# Patient Record
Sex: Female | Born: 1945
Health system: Southern US, Community
[De-identification: ages and names within clinical notes are randomized; demographics above are authoritative.]

## PROBLEM LIST (undated history)

## (undated) DIAGNOSIS — T7840XA Allergy, unspecified, initial encounter: Secondary | ICD-10-CM

## (undated) DIAGNOSIS — A0472 Enterocolitis due to Clostridium difficile, not specified as recurrent: Secondary | ICD-10-CM

## (undated) DIAGNOSIS — D649 Anemia, unspecified: Secondary | ICD-10-CM

## (undated) DIAGNOSIS — F329 Major depressive disorder, single episode, unspecified: Secondary | ICD-10-CM

## (undated) DIAGNOSIS — M1990 Unspecified osteoarthritis, unspecified site: Secondary | ICD-10-CM

## (undated) DIAGNOSIS — Z86010 Personal history of colonic polyps: Principal | ICD-10-CM

## (undated) DIAGNOSIS — H269 Unspecified cataract: Secondary | ICD-10-CM

## (undated) DIAGNOSIS — K219 Gastro-esophageal reflux disease without esophagitis: Secondary | ICD-10-CM

## (undated) DIAGNOSIS — F32A Depression, unspecified: Secondary | ICD-10-CM

## (undated) DIAGNOSIS — E785 Hyperlipidemia, unspecified: Secondary | ICD-10-CM

## (undated) HISTORY — DX: Unspecified osteoarthritis, unspecified site: M19.90

## (undated) HISTORY — DX: Unspecified cataract: H26.9

## (undated) HISTORY — DX: Personal history of colonic polyps: Z86.010

## (undated) HISTORY — DX: Depression, unspecified: F32.A

## (undated) HISTORY — DX: Allergy, unspecified, initial encounter: T78.40XA

## (undated) HISTORY — PX: COLONOSCOPY: SHX174

## (undated) HISTORY — DX: Enterocolitis due to Clostridium difficile, not specified as recurrent: A04.72

## (undated) HISTORY — DX: Major depressive disorder, single episode, unspecified: F32.9

## (undated) HISTORY — DX: Gastro-esophageal reflux disease without esophagitis: K21.9

## (undated) HISTORY — DX: Anemia, unspecified: D64.9

## (undated) HISTORY — DX: Hyperlipidemia, unspecified: E78.5

---

## 1970-02-13 HISTORY — PX: WISDOM TOOTH EXTRACTION: SHX21

## 1985-02-13 HISTORY — PX: TUBAL LIGATION: SHX77

## 2015-07-26 DIAGNOSIS — M62838 Other muscle spasm: Secondary | ICD-10-CM | POA: Diagnosis not present

## 2015-07-26 DIAGNOSIS — R278 Other lack of coordination: Secondary | ICD-10-CM | POA: Diagnosis not present

## 2015-07-26 DIAGNOSIS — N8189 Other female genital prolapse: Secondary | ICD-10-CM | POA: Diagnosis not present

## 2015-07-26 DIAGNOSIS — M6281 Muscle weakness (generalized): Secondary | ICD-10-CM | POA: Diagnosis not present

## 2015-08-23 DIAGNOSIS — R278 Other lack of coordination: Secondary | ICD-10-CM | POA: Diagnosis not present

## 2015-08-23 DIAGNOSIS — M62838 Other muscle spasm: Secondary | ICD-10-CM | POA: Diagnosis not present

## 2015-08-23 DIAGNOSIS — M6281 Muscle weakness (generalized): Secondary | ICD-10-CM | POA: Diagnosis not present

## 2015-08-23 DIAGNOSIS — R159 Full incontinence of feces: Secondary | ICD-10-CM | POA: Diagnosis not present

## 2015-11-23 DIAGNOSIS — F329 Major depressive disorder, single episode, unspecified: Secondary | ICD-10-CM | POA: Diagnosis not present

## 2015-11-23 DIAGNOSIS — Z23 Encounter for immunization: Secondary | ICD-10-CM | POA: Diagnosis not present

## 2015-11-23 DIAGNOSIS — D126 Benign neoplasm of colon, unspecified: Secondary | ICD-10-CM | POA: Diagnosis not present

## 2015-11-23 DIAGNOSIS — I7300 Raynaud's syndrome without gangrene: Secondary | ICD-10-CM | POA: Diagnosis not present

## 2015-11-23 DIAGNOSIS — N8189 Other female genital prolapse: Secondary | ICD-10-CM | POA: Diagnosis not present

## 2015-11-23 DIAGNOSIS — M1990 Unspecified osteoarthritis, unspecified site: Secondary | ICD-10-CM | POA: Diagnosis not present

## 2015-11-23 DIAGNOSIS — Z1231 Encounter for screening mammogram for malignant neoplasm of breast: Secondary | ICD-10-CM | POA: Diagnosis not present

## 2015-11-23 DIAGNOSIS — Z0000 Encounter for general adult medical examination without abnormal findings: Secondary | ICD-10-CM | POA: Diagnosis not present

## 2015-11-23 DIAGNOSIS — D229 Melanocytic nevi, unspecified: Secondary | ICD-10-CM | POA: Diagnosis not present

## 2015-11-23 DIAGNOSIS — Z1389 Encounter for screening for other disorder: Secondary | ICD-10-CM | POA: Diagnosis not present

## 2015-11-23 DIAGNOSIS — E784 Other hyperlipidemia: Secondary | ICD-10-CM | POA: Diagnosis not present

## 2015-11-23 DIAGNOSIS — Z6821 Body mass index (BMI) 21.0-21.9, adult: Secondary | ICD-10-CM | POA: Diagnosis not present

## 2015-11-26 DIAGNOSIS — N390 Urinary tract infection, site not specified: Secondary | ICD-10-CM | POA: Diagnosis not present

## 2015-11-26 DIAGNOSIS — R8299 Other abnormal findings in urine: Secondary | ICD-10-CM | POA: Diagnosis not present

## 2015-11-26 DIAGNOSIS — E785 Hyperlipidemia, unspecified: Secondary | ICD-10-CM | POA: Diagnosis not present

## 2015-11-26 DIAGNOSIS — Z0000 Encounter for general adult medical examination without abnormal findings: Secondary | ICD-10-CM | POA: Diagnosis not present

## 2015-11-26 DIAGNOSIS — E784 Other hyperlipidemia: Secondary | ICD-10-CM | POA: Diagnosis not present

## 2015-12-07 ENCOUNTER — Telehealth: Payer: Self-pay | Admitting: Internal Medicine

## 2015-12-07 NOTE — Telephone Encounter (Signed)
Dr Danielle Wilkins DOD on 12-01-15 when Colon report from 2012 were received. Placed in office. Would like them reviewed so she can schedule her Recall Colon due in 66yrs.

## 2015-12-09 NOTE — Telephone Encounter (Signed)
Dr Gessner reviewed records and has accepted patient. Ok to schedule Direct Colon. Left message for patient to return my call. °

## 2015-12-10 ENCOUNTER — Encounter: Payer: Self-pay | Admitting: Internal Medicine

## 2015-12-24 DIAGNOSIS — L578 Other skin changes due to chronic exposure to nonionizing radiation: Secondary | ICD-10-CM | POA: Diagnosis not present

## 2015-12-24 DIAGNOSIS — M2012 Hallux valgus (acquired), left foot: Secondary | ICD-10-CM | POA: Diagnosis not present

## 2015-12-24 DIAGNOSIS — M2022 Hallux rigidus, left foot: Secondary | ICD-10-CM | POA: Diagnosis not present

## 2015-12-24 DIAGNOSIS — D2371 Other benign neoplasm of skin of right lower limb, including hip: Secondary | ICD-10-CM | POA: Diagnosis not present

## 2015-12-24 DIAGNOSIS — D1801 Hemangioma of skin and subcutaneous tissue: Secondary | ICD-10-CM | POA: Diagnosis not present

## 2015-12-24 DIAGNOSIS — I788 Other diseases of capillaries: Secondary | ICD-10-CM | POA: Diagnosis not present

## 2015-12-24 DIAGNOSIS — L821 Other seborrheic keratosis: Secondary | ICD-10-CM | POA: Diagnosis not present

## 2015-12-24 DIAGNOSIS — L738 Other specified follicular disorders: Secondary | ICD-10-CM | POA: Diagnosis not present

## 2016-01-26 ENCOUNTER — Ambulatory Visit (AMBULATORY_SURGERY_CENTER): Payer: Self-pay | Admitting: *Deleted

## 2016-01-26 VITALS — Ht 63.0 in | Wt 125.0 lb

## 2016-01-26 DIAGNOSIS — Z86010 Personal history of colonic polyps: Secondary | ICD-10-CM

## 2016-01-26 NOTE — Progress Notes (Signed)
No allergies to eggs or soy. No problems with anesthesia.  Pt given Emmi instructions for colonoscopy  No oxygen use  No diet drug use  

## 2016-02-09 ENCOUNTER — Encounter: Payer: Self-pay | Admitting: Internal Medicine

## 2016-02-23 ENCOUNTER — Ambulatory Visit (AMBULATORY_SURGERY_CENTER): Payer: Medicare PPO | Admitting: Internal Medicine

## 2016-02-23 ENCOUNTER — Encounter: Payer: Self-pay | Admitting: Internal Medicine

## 2016-02-23 VITALS — BP 115/55 | HR 51 | Temp 98.6°F | Resp 14 | Ht 64.0 in | Wt 124.0 lb

## 2016-02-23 DIAGNOSIS — Z800 Family history of malignant neoplasm of digestive organs: Secondary | ICD-10-CM | POA: Diagnosis not present

## 2016-02-23 DIAGNOSIS — Z860100 Personal history of colon polyps, unspecified: Secondary | ICD-10-CM

## 2016-02-23 DIAGNOSIS — D120 Benign neoplasm of cecum: Secondary | ICD-10-CM

## 2016-02-23 DIAGNOSIS — K635 Polyp of colon: Secondary | ICD-10-CM | POA: Diagnosis not present

## 2016-02-23 HISTORY — DX: Personal history of colon polyps, unspecified: Z86.0100

## 2016-02-23 HISTORY — DX: Personal history of colonic polyps: Z86.010

## 2016-02-23 NOTE — Patient Instructions (Addendum)
I found and removed one small polyp that looks benign. You also have a condition called diverticulosis - common and not usually a problem. Please read the handout provided.  I will let you know pathology results and when to have another routine colonoscopy by mail. Probably 5 years.  I appreciate the opportunity to care for you. Gatha Mayer, MD, [facility]   Discharge instructions given. Handouts on polyps and diverticulosis. Resume previous medications. YOU HAD AN ENDOSCOPIC PROCEDURE TODAY AT [facility]:   Refer to the procedure report that was given to you for any specific questions about what was found during the examination.  If the procedure report does not answer your questions, please call your gastroenterologist to clarify.  If you requested that your care partner not be given the details of your procedure findings, then the procedure report has been included in a sealed envelope for you to review at your convenience later.  YOU SHOULD EXPECT: Some feelings of bloating in the abdomen. Passage of more gas than usual.  Walking can help get rid of the air that was put into your GI tract during the procedure and reduce the bloating. If you had a lower endoscopy (such as a colonoscopy or flexible sigmoidoscopy) you may notice spotting of blood in your stool or on the toilet paper. If you underwent a bowel prep for your procedure, you may not have a normal bowel movement for a few days.  Please Note:  You might notice some irritation and congestion in your nose or some drainage.  This is from the oxygen used during your procedure.  There is no need for concern and it should clear up in a day or so.  SYMPTOMS TO REPORT IMMEDIATELY:   Following lower endoscopy (colonoscopy or flexible sigmoidoscopy):  Excessive amounts of blood in the stool  Significant tenderness or worsening of abdominal pains  Swelling of the abdomen that is new, acute  Fever of 100F or  higher   For urgent or emergent issues, a gastroenterologist can be reached at any hour by calling [phone].   DIET:  We do recommend a small meal at first, but then you may proceed to your regular diet.  Drink plenty of fluids but you should avoid alcoholic beverages for 24 hours.  ACTIVITY:  You should plan to take it easy for the rest of today and you should NOT DRIVE or use heavy machinery until tomorrow (because of the sedation medicines used during the test).    FOLLOW UP: Our staff will call the number listed on your records the next business day following your procedure to check on you and address any questions or concerns that you may have regarding the information given to you following your procedure. If we do not reach you, we will leave a message.  However, if you are feeling well and you are not experiencing any problems, there is no need to return our call.  We will assume that you have returned to your regular daily activities without incident.  If any biopsies were taken you will be contacted by phone or by letter within the next 1-3 weeks.  Please call us at [phone] if you have not heard about the biopsies in 3 weeks.    SIGNATURES/CONFIDENTIALITY: You and/or your care partner have signed paperwork which will be entered into your electronic medical record.  These signatures attest to the fact that that the information above on your After Visit Summary has  been reviewed and is understood.  Full responsibility of the confidentiality of this discharge information lies with you and/or your care-partner.

## 2016-02-23 NOTE — Progress Notes (Signed)
Report to PACU, RN, vss, BBS= Clear.  

## 2016-02-23 NOTE — Op Note (Signed)
[facility] Patient Name: Danielle Wilkins Procedure Date: 02/23/2016 2:11 PM MRN: [id number] Endoscopist: Gatha Mayer , MD Age: 71 Referring MD:  Date of Birth: 12-06-1945 Gender: Female Account #: [id number] Procedure:                Colonoscopy Indications:              High risk colon cancer surveillance: Personal                            history of colonic polyps, Surveillance: Personal                            history of adenomatous polyps on last colonoscopy 5                            years ago, Family history of colon cancer in a                            first-degree relative Medicines:                Propofol per Anesthesia, Monitored Anesthesia Care Procedure:                Pre-Anesthesia Assessment:                           - Prior to the procedure, a History and Physical                            was performed, and patient medications and                            allergies were reviewed. The patient's tolerance of                            previous anesthesia was also reviewed. The risks                            and benefits of the procedure and the sedation                            options and risks were discussed with the patient.                            All questions were answered, and informed consent                            was obtained. Prior Anticoagulants: The patient has                            taken no previous anticoagulant or antiplatelet                            agents. ASA Grade Assessment: II - A patient with  mild systemic disease. After reviewing the risks                            and benefits, the patient was deemed in                            satisfactory condition to undergo the procedure.                           After obtaining informed consent, the colonoscope                            was passed under direct vision. Throughout the                            procedure, the  patient's blood pressure, pulse, and                            oxygen saturations were monitored continuously. The                            Model CF-HQ190L [id number]) scope was introduced                            through the anus and advanced to the the cecum,                            identified by appendiceal orifice and ileocecal                            valve. The colonoscopy was performed without                            difficulty. The patient tolerated the procedure                            well. The quality of the bowel preparation was                            good. The ileocecal valve, appendiceal orifice, and                            rectum were photographed. The bowel preparation                            used was Miralax. Scope In: 2:19:17 PM Scope Out: 2:36:21 PM Scope Withdrawal Time: 0 hours 11 minutes 14 seconds  Total Procedure Duration: 0 hours 17 minutes 4 seconds  Findings:                 The perianal exam findings include skin tags.                           The digital rectal exam was normal.  A 7 mm polyp was found in the cecum. The polyp was                            sessile. The polyp was removed with a cold snare.                            Resection and retrieval were complete. Verification                            of patient identification for the specimen was                            done. Estimated blood loss was minimal.                           A few small and large-mouthed diverticula were                            found in the sigmoid colon.                           The exam was otherwise without abnormality on                            direct and retroflexion views. Complications:            No immediate complications. Estimated Blood Loss:     Estimated blood loss was minimal. Impression:               - Perianal skin tags found on perianal exam.                           - One 7 mm polyp in the  cecum, removed with a cold                            snare. Resected and retrieved.                           - Diverticulosis in the sigmoid colon.                           - The examination was otherwise normal on direct                            and retroflexion views.                           - Personal history of colonic polyps. Recommendation:           - Patient has a contact number available for                            emergencies. The signs and symptoms of potential  delayed complications were discussed with the                            patient. Return to normal activities tomorrow.                            Written discharge instructions were provided to the                            patient.                           - Resume previous diet.                           - Continue present medications.                           - Repeat colonoscopy is recommended for                            surveillance. The colonoscopy date will be                            determined after pathology results from today's                            exam become available for review. Gatha Mayer, MD 02/23/2016 2:46:07 PM This report has been signed electronically.

## 2016-02-23 NOTE — Progress Notes (Signed)
Called to room to assist during endoscopic procedure.  Patient ID and intended procedure confirmed with present staff. Received instructions for my participation in the procedure from the performing physician.  

## 2016-02-24 ENCOUNTER — Telehealth: Payer: Self-pay

## 2016-02-24 NOTE — Telephone Encounter (Signed)
Name identifier left voicemail we will call back later today.

## 2016-02-24 NOTE — Telephone Encounter (Signed)
  Follow up Call-  Call back number 02/23/2016  Post procedure Call Back phone  # [phone]  Permission to leave phone message Yes     Patient questions:  Do you have a fever, pain , or abdominal swelling? No. Pain Score  0 *  Have you tolerated food without any problems? Yes.    Have you been able to return to your normal activities? Yes.    Do you have any questions about your discharge instructions: Diet   No. Medications  No. Follow up visit  No.  Do you have questions or concerns about your Care? No.  Actions: * If pain score is 4 or above: No action needed, pain <4.

## 2016-02-28 ENCOUNTER — Encounter: Payer: Self-pay | Admitting: Internal Medicine

## 2016-02-28 DIAGNOSIS — Z86010 Personal history of colonic polyps: Secondary | ICD-10-CM

## 2016-02-28 NOTE — Progress Notes (Signed)
7 mm ssp Recall 2023

## 2016-04-20 ENCOUNTER — Telehealth: Payer: Self-pay | Admitting: Internal Medicine

## 2016-04-20 ENCOUNTER — Other Ambulatory Visit: Payer: Medicare Other

## 2016-04-20 DIAGNOSIS — R197 Diarrhea, unspecified: Secondary | ICD-10-CM | POA: Diagnosis not present

## 2016-04-20 NOTE — Telephone Encounter (Signed)
Patient notified  Orders placed.  She will come today to leave a sample

## 2016-04-20 NOTE — Telephone Encounter (Signed)
C diff PCR is next step so please do  Dx:  diarrhea

## 2016-04-20 NOTE — Telephone Encounter (Signed)
Patient with 1 week of watery diarrhea.She recently has had 2 rounds of clindamycin for a abscess tooth.  No App appts.  Can I order a c-diff on her? Would you want other studies.  She recently had a colonoscopy with you in Jan.

## 2016-04-21 ENCOUNTER — Telehealth: Payer: Self-pay | Admitting: Internal Medicine

## 2016-04-21 LAB — CLOSTRIDIUM DIFFICILE BY PCR: CDIFFPCR: DETECTED — AB

## 2016-04-21 NOTE — Telephone Encounter (Signed)
Patient is asking for patient education matherials if she does have c-diff.  I put information out front for her to pick up

## 2016-04-21 NOTE — Progress Notes (Signed)
Called patient Called in RX vancomycin 125 mg qid to [facility] x 10d Also to do Florastor 250 mg bid x 1 month  About to have grandchild (delivery soon) I told her to ask pediatrician about C diff contact issues but suspect best to avoid contact until sxs much better/resolved

## 2016-04-24 NOTE — Progress Notes (Signed)
If she has used fluconazole in past and thinks she has infection can Rx 150 mg # 1 take once no refills  Otherwise can try OTC

## 2016-04-24 NOTE — Progress Notes (Signed)
Please check on her re: sxs of C diff

## 2016-04-25 ENCOUNTER — Encounter: Payer: Self-pay | Admitting: Internal Medicine

## 2016-04-25 DIAGNOSIS — A0472 Enterocolitis due to Clostridium difficile, not specified as recurrent: Secondary | ICD-10-CM

## 2016-04-25 HISTORY — DX: Enterocolitis due to Clostridium difficile, not specified as recurrent: A04.72

## 2016-04-25 NOTE — Progress Notes (Signed)
OK sounds good  At this point if she gets recurrent diarrhea after Tx  she should call us back  Please advise that there is often some IBS after C diff so may not be same as she was for a while but need to know about any bad diarrhea  Hope her trip goes well and the new grandchild is ok

## 2016-07-05 DIAGNOSIS — Z6821 Body mass index (BMI) 21.0-21.9, adult: Secondary | ICD-10-CM | POA: Diagnosis not present

## 2016-07-05 DIAGNOSIS — E785 Hyperlipidemia, unspecified: Secondary | ICD-10-CM | POA: Diagnosis not present

## 2016-07-05 DIAGNOSIS — Z7982 Long term (current) use of aspirin: Secondary | ICD-10-CM | POA: Diagnosis not present

## 2016-07-05 DIAGNOSIS — F3340 Major depressive disorder, recurrent, in remission, unspecified: Secondary | ICD-10-CM | POA: Diagnosis not present

## 2016-07-05 DIAGNOSIS — Z9851 Tubal ligation status: Secondary | ICD-10-CM | POA: Diagnosis not present

## 2016-10-18 DIAGNOSIS — E784 Other hyperlipidemia: Secondary | ICD-10-CM | POA: Diagnosis not present

## 2016-10-18 DIAGNOSIS — Z79899 Other long term (current) drug therapy: Secondary | ICD-10-CM | POA: Diagnosis not present

## 2016-10-24 ENCOUNTER — Other Ambulatory Visit: Payer: Self-pay | Admitting: Internal Medicine

## 2016-10-24 DIAGNOSIS — E784 Other hyperlipidemia: Secondary | ICD-10-CM | POA: Diagnosis not present

## 2016-10-24 DIAGNOSIS — M1990 Unspecified osteoarthritis, unspecified site: Secondary | ICD-10-CM | POA: Diagnosis not present

## 2016-10-24 DIAGNOSIS — Z1231 Encounter for screening mammogram for malignant neoplasm of breast: Secondary | ICD-10-CM | POA: Diagnosis not present

## 2016-10-24 DIAGNOSIS — I7300 Raynaud's syndrome without gangrene: Secondary | ICD-10-CM | POA: Diagnosis not present

## 2016-10-24 DIAGNOSIS — Z23 Encounter for immunization: Secondary | ICD-10-CM | POA: Diagnosis not present

## 2016-10-24 DIAGNOSIS — D126 Benign neoplasm of colon, unspecified: Secondary | ICD-10-CM | POA: Diagnosis not present

## 2016-10-24 DIAGNOSIS — Z0000 Encounter for general adult medical examination without abnormal findings: Secondary | ICD-10-CM | POA: Diagnosis not present

## 2016-10-24 DIAGNOSIS — D229 Melanocytic nevi, unspecified: Secondary | ICD-10-CM | POA: Diagnosis not present

## 2016-10-24 DIAGNOSIS — H00019 Hordeolum externum unspecified eye, unspecified eyelid: Secondary | ICD-10-CM | POA: Diagnosis not present

## 2016-10-24 DIAGNOSIS — N8189 Other female genital prolapse: Secondary | ICD-10-CM | POA: Diagnosis not present

## 2016-10-30 DIAGNOSIS — H0011 Chalazion right upper eyelid: Secondary | ICD-10-CM | POA: Diagnosis not present

## 2016-11-08 ENCOUNTER — Ambulatory Visit
Admission: RE | Admit: 2016-11-08 | Discharge: 2016-11-08 | Disposition: A | Discharge status: Home / Self Care | Payer: Medicare PPO | Source: Ambulatory Visit | Attending: Internal Medicine | Admitting: Internal Medicine

## 2016-11-08 DIAGNOSIS — Z1231 Encounter for screening mammogram for malignant neoplasm of breast: Secondary | ICD-10-CM

## 2016-12-04 DIAGNOSIS — H0011 Chalazion right upper eyelid: Secondary | ICD-10-CM | POA: Diagnosis not present

## 2017-03-06 DIAGNOSIS — H2513 Age-related nuclear cataract, bilateral: Secondary | ICD-10-CM | POA: Diagnosis not present

## 2017-03-06 DIAGNOSIS — H524 Presbyopia: Secondary | ICD-10-CM | POA: Diagnosis not present

## 2017-03-07 DIAGNOSIS — H269 Unspecified cataract: Secondary | ICD-10-CM | POA: Diagnosis not present

## 2017-03-07 DIAGNOSIS — Z6821 Body mass index (BMI) 21.0-21.9, adult: Secondary | ICD-10-CM | POA: Diagnosis not present

## 2017-03-07 DIAGNOSIS — E785 Hyperlipidemia, unspecified: Secondary | ICD-10-CM | POA: Diagnosis not present

## 2017-03-07 DIAGNOSIS — F3340 Major depressive disorder, recurrent, in remission, unspecified: Secondary | ICD-10-CM | POA: Diagnosis not present

## 2017-03-07 DIAGNOSIS — M158 Other polyosteoarthritis: Secondary | ICD-10-CM | POA: Diagnosis not present

## 2017-04-18 DIAGNOSIS — Z6821 Body mass index (BMI) 21.0-21.9, adult: Secondary | ICD-10-CM | POA: Diagnosis not present

## 2017-04-18 DIAGNOSIS — R05 Cough: Secondary | ICD-10-CM | POA: Diagnosis not present

## 2017-04-18 DIAGNOSIS — H1013 Acute atopic conjunctivitis, bilateral: Secondary | ICD-10-CM | POA: Diagnosis not present

## 2017-04-18 DIAGNOSIS — J302 Other seasonal allergic rhinitis: Secondary | ICD-10-CM | POA: Diagnosis not present

## 2017-11-28 DIAGNOSIS — R82998 Other abnormal findings in urine: Secondary | ICD-10-CM | POA: Diagnosis not present

## 2017-11-28 DIAGNOSIS — E7849 Other hyperlipidemia: Secondary | ICD-10-CM | POA: Diagnosis not present

## 2017-12-05 DIAGNOSIS — D229 Melanocytic nevi, unspecified: Secondary | ICD-10-CM | POA: Diagnosis not present

## 2017-12-05 DIAGNOSIS — D7589 Other specified diseases of blood and blood-forming organs: Secondary | ICD-10-CM | POA: Diagnosis not present

## 2017-12-05 DIAGNOSIS — E7849 Other hyperlipidemia: Secondary | ICD-10-CM | POA: Diagnosis not present

## 2017-12-05 DIAGNOSIS — N8189 Other female genital prolapse: Secondary | ICD-10-CM | POA: Diagnosis not present

## 2017-12-05 DIAGNOSIS — D126 Benign neoplasm of colon, unspecified: Secondary | ICD-10-CM | POA: Diagnosis not present

## 2017-12-05 DIAGNOSIS — J302 Other seasonal allergic rhinitis: Secondary | ICD-10-CM | POA: Diagnosis not present

## 2017-12-05 DIAGNOSIS — Z23 Encounter for immunization: Secondary | ICD-10-CM | POA: Diagnosis not present

## 2017-12-05 DIAGNOSIS — Z0000 Encounter for general adult medical examination without abnormal findings: Secondary | ICD-10-CM | POA: Diagnosis not present

## 2017-12-05 DIAGNOSIS — I7300 Raynaud's syndrome without gangrene: Secondary | ICD-10-CM | POA: Diagnosis not present

## 2017-12-05 DIAGNOSIS — M1990 Unspecified osteoarthritis, unspecified site: Secondary | ICD-10-CM | POA: Diagnosis not present

## 2017-12-07 DIAGNOSIS — Z1212 Encounter for screening for malignant neoplasm of rectum: Secondary | ICD-10-CM | POA: Diagnosis not present

## 2018-01-04 DIAGNOSIS — D1801 Hemangioma of skin and subcutaneous tissue: Secondary | ICD-10-CM | POA: Diagnosis not present

## 2018-01-04 DIAGNOSIS — L821 Other seborrheic keratosis: Secondary | ICD-10-CM | POA: Diagnosis not present

## 2018-01-04 DIAGNOSIS — L812 Freckles: Secondary | ICD-10-CM | POA: Diagnosis not present

## 2018-01-04 DIAGNOSIS — D485 Neoplasm of uncertain behavior of skin: Secondary | ICD-10-CM | POA: Diagnosis not present

## 2018-03-11 DIAGNOSIS — H52203 Unspecified astigmatism, bilateral: Secondary | ICD-10-CM | POA: Diagnosis not present

## 2018-03-11 DIAGNOSIS — H2512 Age-related nuclear cataract, left eye: Secondary | ICD-10-CM | POA: Diagnosis not present

## 2018-05-04 DIAGNOSIS — E785 Hyperlipidemia, unspecified: Secondary | ICD-10-CM | POA: Diagnosis not present

## 2018-05-04 DIAGNOSIS — E559 Vitamin D deficiency, unspecified: Secondary | ICD-10-CM | POA: Diagnosis not present

## 2018-05-04 DIAGNOSIS — F418 Other specified anxiety disorders: Secondary | ICD-10-CM | POA: Diagnosis not present

## 2018-05-04 DIAGNOSIS — H269 Unspecified cataract: Secondary | ICD-10-CM | POA: Diagnosis not present

## 2018-05-04 DIAGNOSIS — J309 Allergic rhinitis, unspecified: Secondary | ICD-10-CM | POA: Diagnosis not present

## 2018-05-04 DIAGNOSIS — M158 Other polyosteoarthritis: Secondary | ICD-10-CM | POA: Diagnosis not present

## 2018-05-04 DIAGNOSIS — Z6820 Body mass index (BMI) 20.0-20.9, adult: Secondary | ICD-10-CM | POA: Diagnosis not present

## 2018-12-24 ENCOUNTER — Other Ambulatory Visit: Payer: Self-pay

## 2018-12-24 DIAGNOSIS — Z20822 Contact with and (suspected) exposure to covid-19: Secondary | ICD-10-CM

## 2018-12-27 LAB — NOVEL CORONAVIRUS, NAA: SARS-CoV-2, NAA: NOT DETECTED

## 2019-01-06 DIAGNOSIS — E7849 Other hyperlipidemia: Secondary | ICD-10-CM | POA: Diagnosis not present

## 2019-01-10 DIAGNOSIS — R82998 Other abnormal findings in urine: Secondary | ICD-10-CM | POA: Diagnosis not present

## 2019-01-13 DIAGNOSIS — E785 Hyperlipidemia, unspecified: Secondary | ICD-10-CM | POA: Diagnosis not present

## 2019-01-13 DIAGNOSIS — D7589 Other specified diseases of blood and blood-forming organs: Secondary | ICD-10-CM | POA: Diagnosis not present

## 2019-01-13 DIAGNOSIS — N8189 Other female genital prolapse: Secondary | ICD-10-CM | POA: Diagnosis not present

## 2019-01-13 DIAGNOSIS — K219 Gastro-esophageal reflux disease without esophagitis: Secondary | ICD-10-CM | POA: Diagnosis not present

## 2019-01-13 DIAGNOSIS — I7300 Raynaud's syndrome without gangrene: Secondary | ICD-10-CM | POA: Diagnosis not present

## 2019-01-13 DIAGNOSIS — J302 Other seasonal allergic rhinitis: Secondary | ICD-10-CM | POA: Diagnosis not present

## 2019-01-13 DIAGNOSIS — H1013 Acute atopic conjunctivitis, bilateral: Secondary | ICD-10-CM | POA: Diagnosis not present

## 2019-01-13 DIAGNOSIS — Z0000 Encounter for general adult medical examination without abnormal findings: Secondary | ICD-10-CM | POA: Diagnosis not present

## 2019-01-13 DIAGNOSIS — M1990 Unspecified osteoarthritis, unspecified site: Secondary | ICD-10-CM | POA: Diagnosis not present

## 2019-02-05 DIAGNOSIS — Z1212 Encounter for screening for malignant neoplasm of rectum: Secondary | ICD-10-CM | POA: Diagnosis not present

## 2019-03-14 ENCOUNTER — Ambulatory Visit: Payer: Medicare PPO

## 2019-03-22 ENCOUNTER — Ambulatory Visit: Payer: Medicare PPO | Attending: Internal Medicine

## 2019-03-22 DIAGNOSIS — Z23 Encounter for immunization: Secondary | ICD-10-CM | POA: Insufficient documentation

## 2019-03-22 NOTE — Progress Notes (Signed)
   Covid-19 Vaccination Clinic  Name:  Danielle Wilkins    MRN: [id number] DOB: 1946-01-27  03/22/2019  [name] was observed post Covid-19 immunization for 15 minutes without incidence. She was provided with Vaccine Information Sheet and instruction to access the V-Safe system.   [name] was instructed to call 911 with any severe reactions post vaccine: Marland Kitchen Difficulty breathing  . Swelling of your face and throat  . A fast heartbeat  . A bad rash all over your body  . Dizziness and weakness    Immunizations Administered    Name Date Dose VIS Date Route   Pfizer COVID-19 Vaccine 03/22/2019  5:02 PM 0.3 mL 01/24/2019 Intramuscular   Manufacturer: [place]   Lot: [id number]   [place]: [id number]

## 2019-03-25 ENCOUNTER — Ambulatory Visit: Payer: Medicare PPO

## 2019-04-16 ENCOUNTER — Ambulatory Visit: Payer: Medicare PPO | Attending: Internal Medicine

## 2019-04-16 DIAGNOSIS — Z23 Encounter for immunization: Secondary | ICD-10-CM | POA: Insufficient documentation

## 2019-04-16 NOTE — Progress Notes (Signed)
   Covid-19 Vaccination Clinic  Name:  KORBIN MUESSIG    MRN: [id number] DOB: 02/02/1946  04/16/2019  Ms. Vaughan was observed post Covid-19 immunization for 15 minutes without incident. She was provided with Vaccine Information Sheet and instruction to access the V-Safe system.   Ms. Rosano was instructed to call 911 with any severe reactions post vaccine: Marland Kitchen Difficulty breathing  . Swelling of face and throat  . A fast heartbeat  . A bad rash all over body  . Dizziness and weakness   Immunizations Administered    Name Date Dose VIS Date Route   Pfizer COVID-19 Vaccine 04/16/2019  2:01 PM 0.3 mL 01/24/2019 Intramuscular   Manufacturer: [place]   Lot: [id number]   [place]: [id number]

## 2019-06-03 ENCOUNTER — Other Ambulatory Visit: Payer: Self-pay | Admitting: Internal Medicine

## 2019-06-03 DIAGNOSIS — Z1231 Encounter for screening mammogram for malignant neoplasm of breast: Secondary | ICD-10-CM

## 2019-06-30 ENCOUNTER — Other Ambulatory Visit: Payer: Self-pay

## 2019-06-30 ENCOUNTER — Ambulatory Visit
Admission: RE | Admit: 2019-06-30 | Discharge: 2019-06-30 | Disposition: A | Discharge status: Home / Self Care | Payer: Medicare PPO | Source: Ambulatory Visit | Attending: Internal Medicine | Admitting: Internal Medicine

## 2019-06-30 DIAGNOSIS — Z1231 Encounter for screening mammogram for malignant neoplasm of breast: Secondary | ICD-10-CM | POA: Diagnosis not present

## 2019-08-13 ENCOUNTER — Encounter: Payer: Self-pay | Admitting: Internal Medicine

## 2019-09-19 DIAGNOSIS — Z20822 Contact with and (suspected) exposure to covid-19: Secondary | ICD-10-CM | POA: Diagnosis not present

## 2019-09-19 DIAGNOSIS — Z03818 Encounter for observation for suspected exposure to other biological agents ruled out: Secondary | ICD-10-CM | POA: Diagnosis not present

## 2019-10-16 ENCOUNTER — Ambulatory Visit: Payer: Medicare PPO | Admitting: Internal Medicine

## 2019-10-16 ENCOUNTER — Encounter: Payer: Self-pay | Admitting: Internal Medicine

## 2019-10-16 VITALS — BP 120/60 | HR 84 | Ht 63.5 in | Wt 126.0 lb

## 2019-10-16 DIAGNOSIS — K648 Other hemorrhoids: Secondary | ICD-10-CM

## 2019-10-16 DIAGNOSIS — Z800 Family history of malignant neoplasm of digestive organs: Secondary | ICD-10-CM

## 2019-10-16 DIAGNOSIS — N8189 Other female genital prolapse: Secondary | ICD-10-CM

## 2019-10-16 DIAGNOSIS — Z86010 Personal history of colonic polyps: Secondary | ICD-10-CM

## 2019-10-16 DIAGNOSIS — R159 Full incontinence of feces: Secondary | ICD-10-CM

## 2019-10-16 NOTE — Patient Instructions (Addendum)
Normal BMI (Body Mass Index- based on height and weight) is between 23 and 30. Your BMI today is Body mass index is 21.97 kg/m. Danielle Wilkins Please consider follow up  regarding your BMI with your Primary Care Provider.  You have been scheduled for a colonoscopy. Please follow written instructions given to you at your visit today.  Please pick up your prep supplies at the pharmacy within the next 1-3 days. If you use inhalers (even only as needed), please bring them with you on the day of your procedure.  We have made you an appointment with Dr Maryland Pink for 10/29/2019 at 10:30, you are to arrive at 10:15AM. Please take your insurance card, photo ID, co-pay. They are located at [place]. [address], [place] [address], 2nd floor. Their phone # is [phone]. They are going to mail her out new patient paperwork , please complete and take to your visit.   I appreciate the opportunity to care for you. Silvano Rusk , MD, [facility]

## 2019-10-16 NOTE — Progress Notes (Signed)
Danielle Wilkins 74 y.o. [birth year] [id number]  Assessment & Plan:   Encounter Diagnoses  Name Primary?  Marland Kitchen Hx of colonic polyps Yes  . Family history of colon cancer   . Pelvic floor weakness in female   . Incontinence of feces, unspecified fecal incontinence type   . Hemorrhoids with complication suspected     I think it is reasonable to proceed with a colonoscopy in this lady situation does she does have a history of precancerous colon polyps on multiple occasions and the new family history of colon cancer and brother with small bowel cancer is quite concerning to her and I believe she will have significant reassurance from colonoscopy. We will go ahead and proceed.The risks and benefits as well as alternatives of endoscopic procedure(s) have been discussed and reviewed. All questions answered. The patient agrees to proceed.   It sounds like she does have a good history for pelvic floor weakness she was helped by pelvic floor physical therapy but she is also complaining of symptoms of organ prolapse. We talked about the various ways to evaluate this and she is interested in seeing a urogynecologist. Will refer to Dr. Zigmund Daniel of [facility]. I have explained and she understands that some of her physical symptoms could be related to organ prolapse potentially.  Evaluate hemorrhoids at colonoscopy and see what potential banding would have based upon that exam. Schedule that later if thought appropriate.  I appreciate the opportunity to care for this patient. CC: Crist Infante, MD     Subjective:   Chief Complaint: Schedule colonoscopy please  HPI Patient is a 74 year old white woman with a history of colon polyps, most recently January 2018 with a 7 mm cecal SSP slated for a recall procedure in 2023. She is concerned because her 30 year old sister just died from colon cancer. She also had a brother that had a small intestinal tumor resected though he is alive. She has had some  lower abdominal complaints with some pain bilateral almost like menstrual cramps in the recent month that lasted several days but was self-limited. She does also complain of leakage of stool at times, which is disconcerting. Fecal urgency issues as well. She has difficulty controlling release of flatus also. She thinks she is having problems with both vaginal and possible uterine prolapse though she has not been to a gynecologist in a long time. First delivery was a forceps delivery with her son who was quite large she said and she is competent there was birth trauma at that time. She does not seem to have any significant urine leakage issues.  She has been to pelvic floor physical therapy for her bowel issues and really did get quite a bit of relief from that but has stabilized with these lower level of symptoms. She wonders if she has hemorrhoids because she feels achy and swelling in the anal rectal area at times. She is interested in pursuing the possibility of hemorrhoidal banding if thought helpful. She would like to have a colonoscopy sooner than 2023 due to the family history issues. There is a prior history of a rectal adenoma in 2012 and small polyps of unknown type in 2005. She is very concerned about the potential for colon cancer. Allergies  Allergen Reactions  . Penicillins Rash   Current Meds  Medication Sig  . buPROPion (WELLBUTRIN XL) 150 MG 24 hr tablet Take 150 mg by mouth daily.  . Cholecalciferol (VITAMIN D-3) 125 MCG (5000 UT) TABS Take 1 tablet  by mouth daily.  . Cyanocobalamin (B-12 PO) Take 1,000 mcg by mouth daily.   . rosuvastatin (CRESTOR) 10 MG tablet Take 10 mg by mouth daily.   Past Medical History:  Diagnosis Date  . Arthritis   . C. difficile colitis 04/25/2016  . Depression   . Hx of colonic polyps 02/23/2016   2005 small polyps unknown type 2012 5 mm rectal adenoma 02/23/2016 7 mm cecal polyp   . Hyperlipidemia    Past Surgical History:  Procedure Laterality  Date  . COLONOSCOPY  multiple since 2005   polyps  . TUBAL LIGATION  1987  . [place]   Social History   Social History Narrative   Retired, married   2 children-sons   Former smoker 2 alcoholic beverages a day 1 caffeinated beverage daily   family history includes Colon cancer (age of onset: 58) in her sister; Colon polyps in her father; Heart disease in her brother, father, mother, and sister; Hyperlipidemia in her son; Kidney Stones in her brother, brother, father, and mother; Other in her brother; Stroke in her maternal grandfather, maternal grandmother, and paternal grandfather.   Review of Systems As per HPI some allergy and sinus problems as well otherwise negative  Objective:   Physical Exam BP 120/60 (BP Location: Left Arm, Patient Position: Sitting, Cuff Size: Normal)   Pulse 84   Ht 5' 3.5" (1.613 m) Comment: height measured without shoes  Wt 126 lb (57.2 kg)   BMI 21.97 kg/m  NAD well-developed well-nourished thin white woman She is alert and oriented x3 She has an appropriate affect and mood

## 2019-10-29 DIAGNOSIS — R151 Fecal smearing: Secondary | ICD-10-CM | POA: Diagnosis not present

## 2019-10-29 DIAGNOSIS — N819 Female genital prolapse, unspecified: Secondary | ICD-10-CM | POA: Diagnosis not present

## 2019-10-29 DIAGNOSIS — R152 Fecal urgency: Secondary | ICD-10-CM | POA: Diagnosis not present

## 2019-10-29 DIAGNOSIS — R159 Full incontinence of feces: Secondary | ICD-10-CM | POA: Diagnosis not present

## 2019-10-29 DIAGNOSIS — N952 Postmenopausal atrophic vaginitis: Secondary | ICD-10-CM | POA: Diagnosis not present

## 2019-10-31 ENCOUNTER — Encounter: Payer: Medicare PPO | Admitting: Internal Medicine

## 2020-01-28 DIAGNOSIS — N952 Postmenopausal atrophic vaginitis: Secondary | ICD-10-CM | POA: Diagnosis not present

## 2020-01-28 DIAGNOSIS — R159 Full incontinence of feces: Secondary | ICD-10-CM | POA: Diagnosis not present

## 2020-01-28 DIAGNOSIS — N8111 Cystocele, midline: Secondary | ICD-10-CM | POA: Diagnosis not present

## 2020-01-28 DIAGNOSIS — R152 Fecal urgency: Secondary | ICD-10-CM | POA: Diagnosis not present

## 2020-02-11 DIAGNOSIS — E785 Hyperlipidemia, unspecified: Secondary | ICD-10-CM | POA: Diagnosis not present

## 2020-02-17 DIAGNOSIS — R82998 Other abnormal findings in urine: Secondary | ICD-10-CM | POA: Diagnosis not present

## 2020-02-17 DIAGNOSIS — Z1212 Encounter for screening for malignant neoplasm of rectum: Secondary | ICD-10-CM | POA: Diagnosis not present

## 2020-02-18 DIAGNOSIS — M1990 Unspecified osteoarthritis, unspecified site: Secondary | ICD-10-CM | POA: Diagnosis not present

## 2020-02-18 DIAGNOSIS — Z1331 Encounter for screening for depression: Secondary | ICD-10-CM | POA: Diagnosis not present

## 2020-02-18 DIAGNOSIS — Z79899 Other long term (current) drug therapy: Secondary | ICD-10-CM | POA: Diagnosis not present

## 2020-02-18 DIAGNOSIS — J302 Other seasonal allergic rhinitis: Secondary | ICD-10-CM | POA: Diagnosis not present

## 2020-02-18 DIAGNOSIS — E785 Hyperlipidemia, unspecified: Secondary | ICD-10-CM | POA: Diagnosis not present

## 2020-02-18 DIAGNOSIS — I7300 Raynaud's syndrome without gangrene: Secondary | ICD-10-CM | POA: Diagnosis not present

## 2020-02-18 DIAGNOSIS — F329 Major depressive disorder, single episode, unspecified: Secondary | ICD-10-CM | POA: Diagnosis not present

## 2020-02-18 DIAGNOSIS — D7589 Other specified diseases of blood and blood-forming organs: Secondary | ICD-10-CM | POA: Diagnosis not present

## 2020-02-18 DIAGNOSIS — Z0000 Encounter for general adult medical examination without abnormal findings: Secondary | ICD-10-CM | POA: Diagnosis not present

## 2020-03-11 ENCOUNTER — Ambulatory Visit (AMBULATORY_SURGERY_CENTER): Payer: Self-pay | Admitting: *Deleted

## 2020-03-11 ENCOUNTER — Other Ambulatory Visit: Payer: Self-pay

## 2020-03-11 VITALS — Ht 63.5 in | Wt 125.0 lb

## 2020-03-11 DIAGNOSIS — Z86010 Personal history of colonic polyps: Secondary | ICD-10-CM

## 2020-03-11 DIAGNOSIS — Z800 Family history of malignant neoplasm of digestive organs: Secondary | ICD-10-CM

## 2020-03-11 NOTE — Progress Notes (Signed)

## 2020-03-17 ENCOUNTER — Encounter: Payer: Self-pay | Admitting: Internal Medicine

## 2020-03-23 ENCOUNTER — Encounter: Payer: Self-pay | Admitting: Internal Medicine

## 2020-03-23 ENCOUNTER — Other Ambulatory Visit: Payer: Self-pay

## 2020-03-23 ENCOUNTER — Ambulatory Visit (AMBULATORY_SURGERY_CENTER): Payer: Medicare PPO | Admitting: Internal Medicine

## 2020-03-23 VITALS — BP 114/63 | HR 59 | Temp 97.1°F | Resp 14 | Ht 63.5 in | Wt 125.0 lb

## 2020-03-23 DIAGNOSIS — Z800 Family history of malignant neoplasm of digestive organs: Secondary | ICD-10-CM | POA: Diagnosis not present

## 2020-03-23 DIAGNOSIS — Z86010 Personal history of colonic polyps: Secondary | ICD-10-CM | POA: Diagnosis not present

## 2020-03-23 MED ORDER — SODIUM CHLORIDE 0.9 % IV SOLN
500.0000 mL | INTRAVENOUS | Status: DC
Start: 2020-03-23 — End: 2020-03-23

## 2020-03-23 NOTE — Op Note (Addendum)
[facility] Patient Name: Danielle Wilkins Procedure Date: 03/23/2020 11:05 AM MRN: [id number] Endoscopist: Gatha Mayer , MD Age: 75 Referring MD:  Date of Birth: 19-Jan-1946 Gender: Female Account #: [id number] Procedure:                Colonoscopy Indications:              Last colonoscopy: 2018 Medicines:                Propofol per Anesthesia, Monitored Anesthesia Care Procedure:                Pre-Anesthesia Assessment:                           - Prior to the procedure, a History and Physical                            was performed, and patient medications and                            allergies were reviewed. The patient's tolerance of                            previous anesthesia was also reviewed. The risks                            and benefits of the procedure and the sedation                            options and risks were discussed with the patient.                            All questions were answered, and informed consent                            was obtained. Prior Anticoagulants: The patient has                            taken no previous anticoagulant or antiplatelet                            agents. ASA Grade Assessment: II - A patient with                            mild systemic disease. After reviewing the risks                            and benefits, the patient was deemed in                            satisfactory condition to undergo the procedure.                           After obtaining informed consent, the colonoscope  was passed under direct vision. Throughout the                            procedure, the patient's blood pressure, pulse, and                            oxygen saturations were monitored continuously. The                            Olympus PCF-H190DL ([id number]) Colonoscope was                            introduced through the anus and advanced to the the                            cecum,  identified by appendiceal orifice and                            ileocecal valve. The colonoscopy was performed                            without difficulty. The patient tolerated the                            procedure well. The quality of the bowel                            preparation was excellent. The bowel preparation                            used was Miralax via split dose instruction. The                            ileocecal valve, appendiceal orifice, and rectum                            were photographed. Scope In: 11:16:02 AM Scope Out: 11:32:11 AM Scope Withdrawal Time: 0 hours 9 minutes 26 seconds  Total Procedure Duration: 0 hours 16 minutes 9 seconds  Findings:                 The perianal and digital rectal examinations were                            normal.                           Multiple small and large-mouthed diverticula were                            found in the sigmoid colon.                           The exam was otherwise without abnormality on  direct and retroflexion views. Complications:            No immediate complications. Estimated Blood Loss:     Estimated blood loss: none. Impression:               - Diverticulosis in the sigmoid colon.                           - The examination was otherwise normal on direct                            and retroflexion views.                           - No specimens collected.                           - Personal history of colonic polyps. Recommendation:           - Patient has a contact number available for                            emergencies. The signs and symptoms of potential                            delayed complications were discussed with the                            patient. Return to normal activities tomorrow.                            Written discharge instructions were provided to the                            patient.                           - Resume  previous diet.                           - Continue present medications.                           - CONSIDER Repeat colonoscopy in 5 years for                            surveillance. Will be 45 - but does have FHx CRCA,                            small bowel cancer and personal hx polyps Gatha Mayer, MD 03/23/2020 11:48:51 AM This report has been signed electronically. Addendum Number: 1   Addendum Date: 03/26/2020 12:35:51 PM      Procedure indications:      personal history of colonic polyps      Family history of colon cancer Gatha Mayer, MD 03/26/2020 12:36:28 PM This report has been signed electronically.

## 2020-03-23 NOTE — Progress Notes (Signed)
PT taken to PACU. Monitors in place. VSS. Report given to RN. 

## 2020-03-23 NOTE — Progress Notes (Signed)
Pt's states no medical or surgical changes since previsit or office visit. 

## 2020-03-23 NOTE — Patient Instructions (Addendum)
No polyps today.  Not sure you need to repeat a routine colonoscopy given age and findings but think worth considering again in 5 years due to prior polyp history and family history.  You do not need to do routine stool tests for blood If offered decline them unless trying to understand a problem.  I appreciate the opportunity to care for you. Gatha Mayer, MD, [facility]  Handout on diverticulosis given to you today.    YOU HAD AN ENDOSCOPIC PROCEDURE TODAY AT [facility]:   Refer to the procedure report that was given to you for any specific questions about what was found during the examination.  If the procedure report does not answer your questions, please call your gastroenterologist to clarify.  If you requested that your care partner not be given the details of your procedure findings, then the procedure report has been included in a sealed envelope for you to review at your convenience later.  YOU SHOULD EXPECT: Some feelings of bloating in the abdomen. Passage of more gas than usual.  Walking can help get rid of the air that was put into your GI tract during the procedure and reduce the bloating. If you had a lower endoscopy (such as a colonoscopy or flexible sigmoidoscopy) you may notice spotting of blood in your stool or on the toilet paper. If you underwent a bowel prep for your procedure, you may not have a normal bowel movement for a few days.  Please Note:  You might notice some irritation and congestion in your nose or some drainage.  This is from the oxygen used during your procedure.  There is no need for concern and it should clear up in a day or so.  SYMPTOMS TO REPORT IMMEDIATELY:   Following lower endoscopy (colonoscopy or flexible sigmoidoscopy):  Excessive amounts of blood in the stool  Significant tenderness or worsening of abdominal pains  Swelling of the abdomen that is new, acute  Fever of 100F or higher    For urgent or emergent issues, a  gastroenterologist can be reached at any hour by calling [phone]. Do not use MyChart messaging for urgent concerns.    DIET:  We do recommend a small meal at first, but then you may proceed to your regular diet.  Drink plenty of fluids but you should avoid alcoholic beverages for 24 hours.  ACTIVITY:  You should plan to take it easy for the rest of today and you should NOT DRIVE or use heavy machinery until tomorrow (because of the sedation medicines used during the test).    FOLLOW UP: Our staff will call the number listed on your records 48-72 hours following your procedure to check on you and address any questions or concerns that you may have regarding the information given to you following your procedure. If we do not reach you, we will leave a message.  We will attempt to reach you two times.  During this call, we will ask if you have developed any symptoms of COVID 19. If you develop any symptoms (ie: fever, flu-like symptoms, shortness of breath, cough etc.) before then, please call [phone].  If you test positive for Covid 19 in the 2 weeks post procedure, please call and report this information to [place].    If any biopsies were taken you will be contacted by phone or by letter within the next 1-3 weeks.  Please call us at [phone] if you have not heard about the biopsies  in 3 weeks.    SIGNATURES/CONFIDENTIALITY: You and/or your care partner have signed paperwork which will be entered into your electronic medical record.  These signatures attest to the fact that that the information above on your After Visit Summary has been reviewed and is understood.  Full responsibility of the confidentiality of this discharge information lies with you and/or your care-partner.

## 2020-03-25 ENCOUNTER — Telehealth: Payer: Self-pay

## 2020-03-25 NOTE — Telephone Encounter (Signed)
  Follow up Call-  Call back number 03/23/2020  Permission to leave phone message Yes  Some recent data might be hidden     Patient questions:  Do you have a fever, pain , or abdominal swelling? No. Pain Score  0 *  Have you tolerated food without any problems? Yes.    Have you been able to return to your normal activities? Yes.    Do you have any questions about your discharge instructions: Diet   No. Medications  No. Follow up visit  No.  Do you have questions or concerns about your Care? No.  Actions: * If pain score is 4 or above: 1. No action needed, pain <4.Have you developed a fever since your procedure? no  2.   Have you had an respiratory symptoms (SOB or cough) since your procedure? no  3.   Have you tested positive for COVID 19 since your procedure no  4.   Have you had any family members/close contacts diagnosed with the COVID 19 since your procedure?  no   If yes to any of these questions please route to Joylene John, RN and Joella Prince, RN

## 2020-07-23 DIAGNOSIS — M7542 Impingement syndrome of left shoulder: Secondary | ICD-10-CM | POA: Diagnosis not present

## 2020-09-03 DIAGNOSIS — M25512 Pain in left shoulder: Secondary | ICD-10-CM | POA: Diagnosis not present

## 2020-09-08 DIAGNOSIS — M25512 Pain in left shoulder: Secondary | ICD-10-CM | POA: Diagnosis not present

## 2020-09-10 DIAGNOSIS — M25512 Pain in left shoulder: Secondary | ICD-10-CM | POA: Diagnosis not present

## 2020-09-13 DIAGNOSIS — M25512 Pain in left shoulder: Secondary | ICD-10-CM | POA: Diagnosis not present

## 2020-09-15 DIAGNOSIS — M25512 Pain in left shoulder: Secondary | ICD-10-CM | POA: Diagnosis not present

## 2020-09-15 DIAGNOSIS — M7542 Impingement syndrome of left shoulder: Secondary | ICD-10-CM | POA: Diagnosis not present

## 2020-09-17 DIAGNOSIS — M25512 Pain in left shoulder: Secondary | ICD-10-CM | POA: Diagnosis not present

## 2020-09-27 DIAGNOSIS — M25512 Pain in left shoulder: Secondary | ICD-10-CM | POA: Diagnosis not present

## 2020-09-30 DIAGNOSIS — M25512 Pain in left shoulder: Secondary | ICD-10-CM | POA: Diagnosis not present

## 2020-10-04 DIAGNOSIS — M25512 Pain in left shoulder: Secondary | ICD-10-CM | POA: Diagnosis not present

## 2020-10-20 DIAGNOSIS — L821 Other seborrheic keratosis: Secondary | ICD-10-CM | POA: Diagnosis not present

## 2020-10-20 DIAGNOSIS — D1801 Hemangioma of skin and subcutaneous tissue: Secondary | ICD-10-CM | POA: Diagnosis not present

## 2020-10-20 DIAGNOSIS — L812 Freckles: Secondary | ICD-10-CM | POA: Diagnosis not present

## 2020-10-20 DIAGNOSIS — B078 Other viral warts: Secondary | ICD-10-CM | POA: Diagnosis not present

## 2021-03-22 DIAGNOSIS — E785 Hyperlipidemia, unspecified: Secondary | ICD-10-CM | POA: Diagnosis not present

## 2021-03-29 DIAGNOSIS — M1990 Unspecified osteoarthritis, unspecified site: Secondary | ICD-10-CM | POA: Diagnosis not present

## 2021-03-29 DIAGNOSIS — Z1339 Encounter for screening examination for other mental health and behavioral disorders: Secondary | ICD-10-CM | POA: Diagnosis not present

## 2021-03-29 DIAGNOSIS — R82998 Other abnormal findings in urine: Secondary | ICD-10-CM | POA: Diagnosis not present

## 2021-03-29 DIAGNOSIS — Z79899 Other long term (current) drug therapy: Secondary | ICD-10-CM | POA: Diagnosis not present

## 2021-03-29 DIAGNOSIS — D126 Benign neoplasm of colon, unspecified: Secondary | ICD-10-CM | POA: Diagnosis not present

## 2021-03-29 DIAGNOSIS — E785 Hyperlipidemia, unspecified: Secondary | ICD-10-CM | POA: Diagnosis not present

## 2021-03-29 DIAGNOSIS — I7300 Raynaud's syndrome without gangrene: Secondary | ICD-10-CM | POA: Diagnosis not present

## 2021-03-29 DIAGNOSIS — F329 Major depressive disorder, single episode, unspecified: Secondary | ICD-10-CM | POA: Diagnosis not present

## 2021-03-29 DIAGNOSIS — Z1331 Encounter for screening for depression: Secondary | ICD-10-CM | POA: Diagnosis not present

## 2021-03-29 DIAGNOSIS — Z0000 Encounter for general adult medical examination without abnormal findings: Secondary | ICD-10-CM | POA: Diagnosis not present

## 2021-03-31 ENCOUNTER — Other Ambulatory Visit: Payer: Self-pay | Admitting: Internal Medicine

## 2021-03-31 DIAGNOSIS — E785 Hyperlipidemia, unspecified: Secondary | ICD-10-CM

## 2021-07-11 IMAGING — MG DIGITAL SCREENING BILAT W/ TOMO W/ CAD
8 series · 8 of 24 positions shown · non-contrast
Comparison: Previous exam(s).

CLINICAL DATA: Screening.

EXAM:
DIGITAL SCREENING BILATERAL MAMMOGRAM WITH TOMO AND CAD

[R MLO synth-2D]
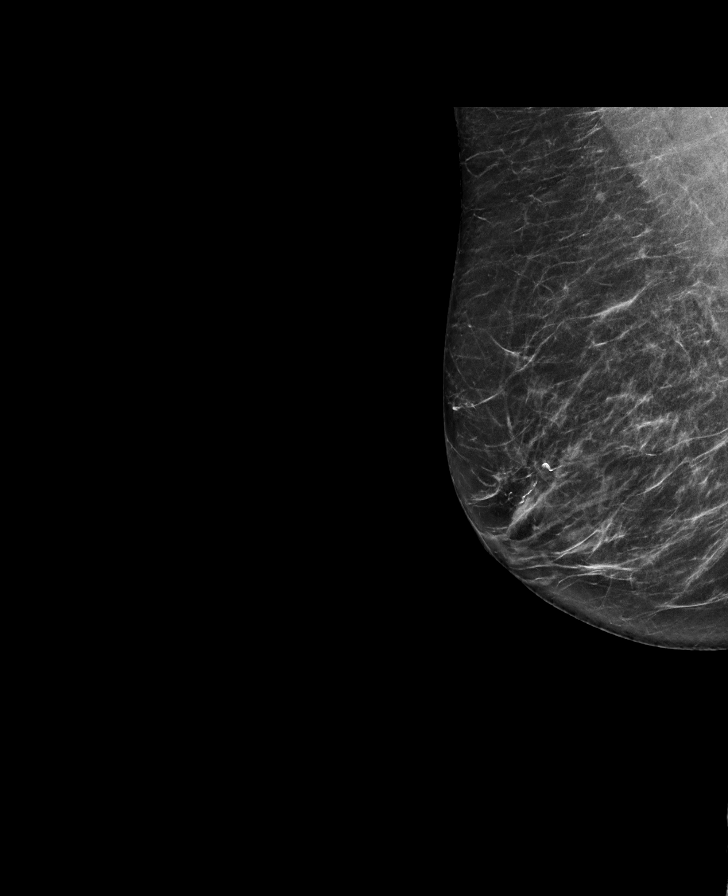

[L MLO synth-2D]
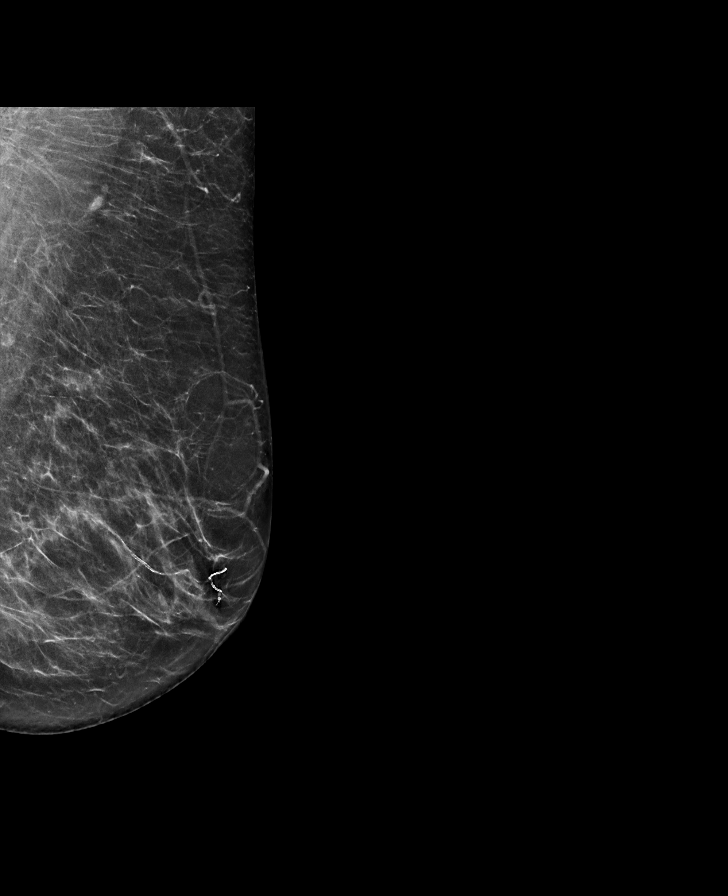

[R CC synth-2D]
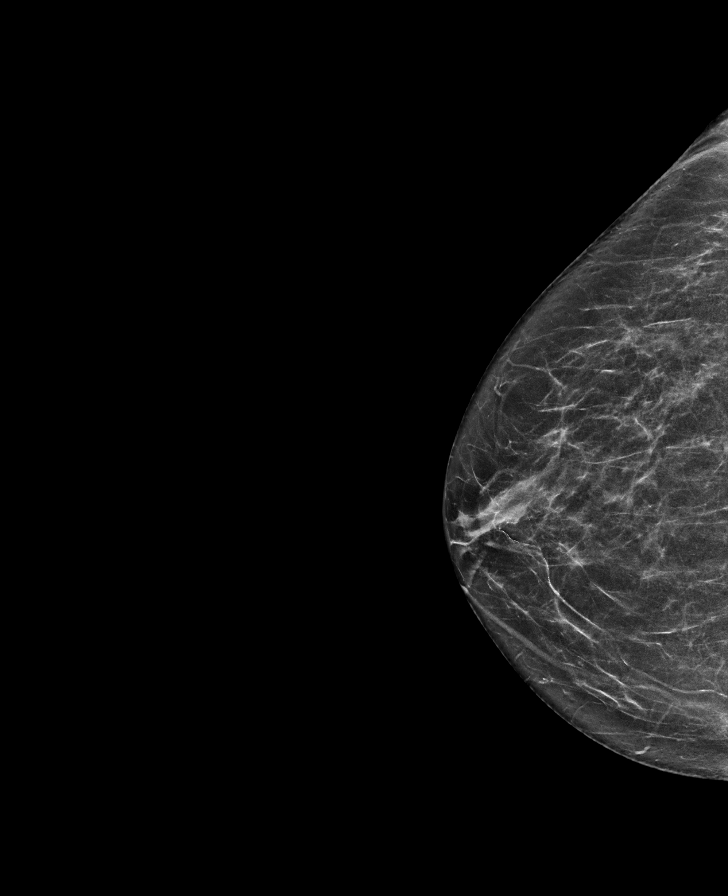

[L CC synth-2D]
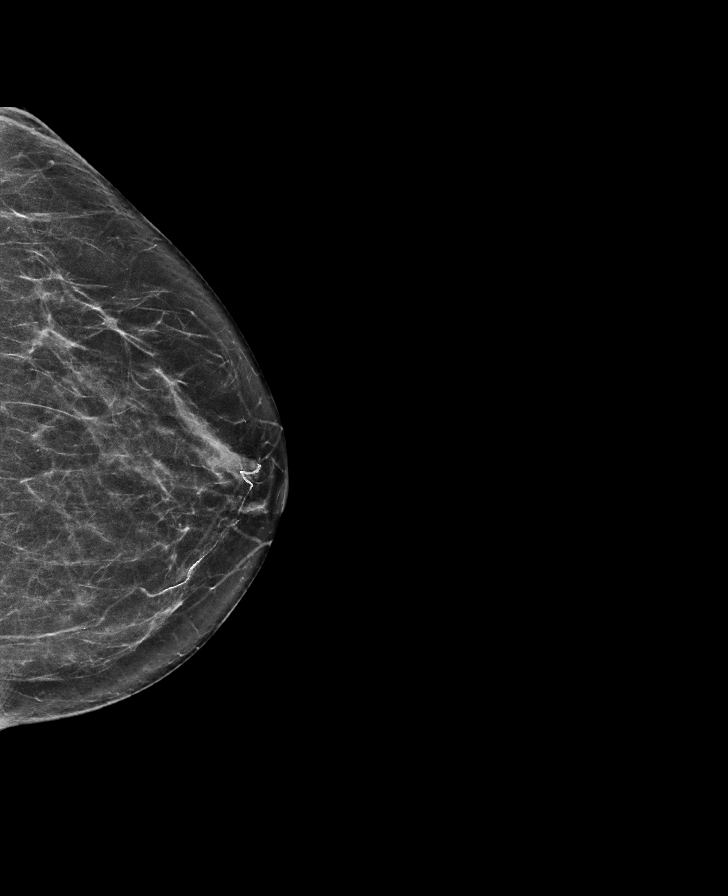

[R MLO tomo · tomo slice 33/64.0]
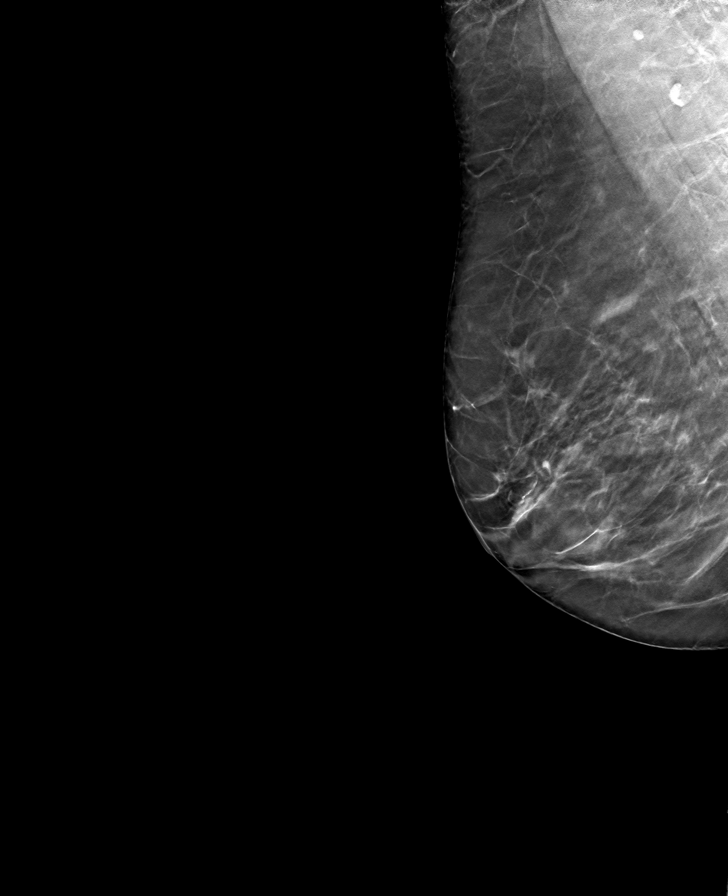

[L CC tomo · tomo slice 33/65.0]
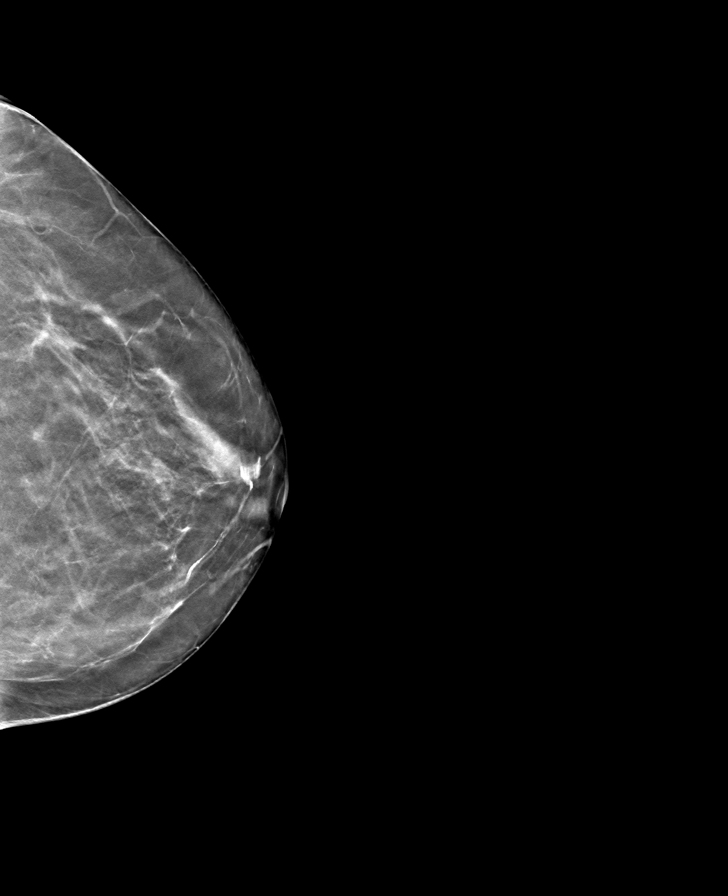

[R CC tomo · tomo slice 32/63.0]
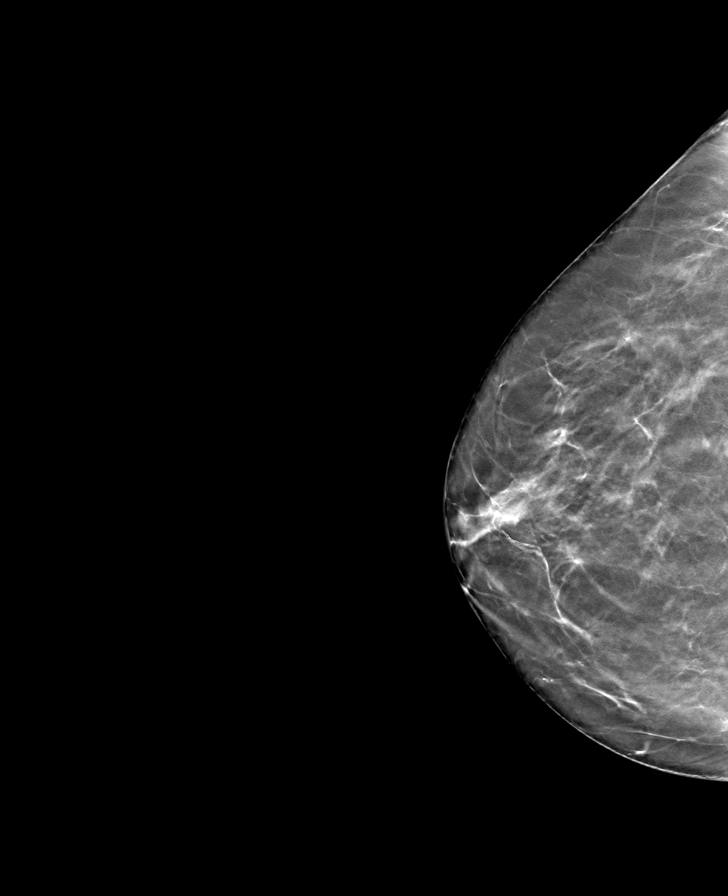

[L MLO tomo · tomo slice 33/66.0]
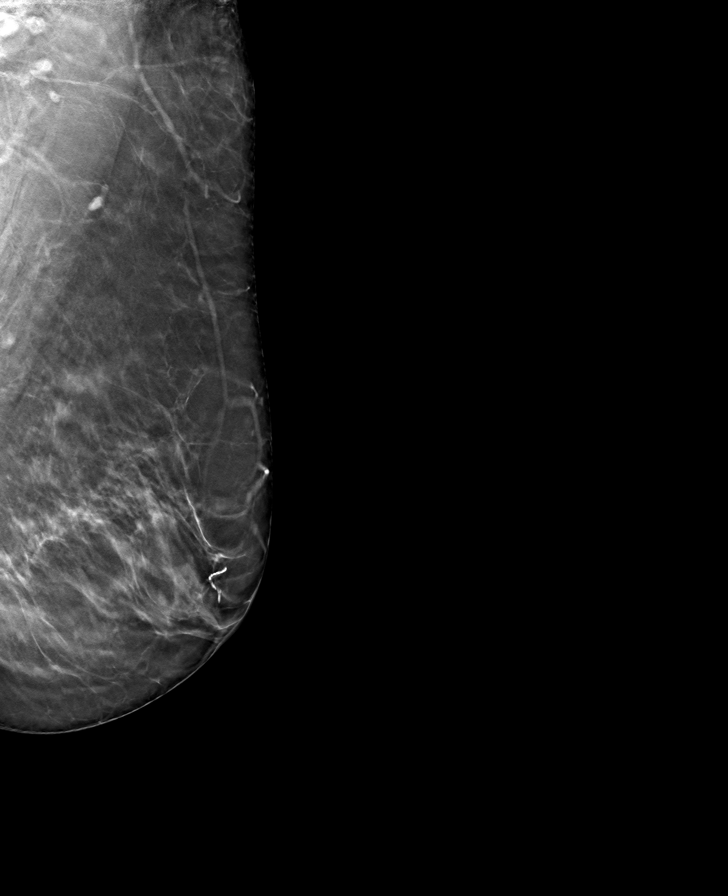

[8 of 24 positions shown; findings below may reference images not displayed]

ACR Breast Density Category b: There are scattered areas of
fibroglandular density.
FINDINGS: There are no findings suspicious for malignancy. Images were
processed with CAD.
IMPRESSION: No mammographic evidence of malignancy. A result letter of this
screening mammogram will be mailed directly to the patient.

RECOMMENDATION:
Screening mammogram in one year. (Code:CN-U-775)

BI-RADS CATEGORY  1: Negative.

## 2021-08-01 ENCOUNTER — Other Ambulatory Visit: Payer: Self-pay | Admitting: Internal Medicine

## 2021-08-01 DIAGNOSIS — Z1231 Encounter for screening mammogram for malignant neoplasm of breast: Secondary | ICD-10-CM

## 2021-08-05 ENCOUNTER — Ambulatory Visit
Admission: RE | Admit: 2021-08-05 | Discharge: 2021-08-05 | Disposition: A | Discharge status: Home / Self Care | Payer: Medicare PPO | Source: Ambulatory Visit | Attending: Internal Medicine | Admitting: Internal Medicine

## 2021-08-05 DIAGNOSIS — Z1231 Encounter for screening mammogram for malignant neoplasm of breast: Secondary | ICD-10-CM

## 2021-10-25 ENCOUNTER — Ambulatory Visit: Payer: Medicare PPO | Admitting: Dermatology

## 2021-10-25 DIAGNOSIS — L578 Other skin changes due to chronic exposure to nonionizing radiation: Secondary | ICD-10-CM | POA: Diagnosis not present

## 2021-10-25 DIAGNOSIS — L821 Other seborrheic keratosis: Secondary | ICD-10-CM

## 2021-10-25 DIAGNOSIS — L814 Other melanin hyperpigmentation: Secondary | ICD-10-CM | POA: Diagnosis not present

## 2021-10-25 DIAGNOSIS — L988 Other specified disorders of the skin and subcutaneous tissue: Secondary | ICD-10-CM | POA: Diagnosis not present

## 2021-10-25 DIAGNOSIS — Z808 Family history of malignant neoplasm of other organs or systems: Secondary | ICD-10-CM | POA: Diagnosis not present

## 2021-10-25 DIAGNOSIS — D1800 Hemangioma unspecified site: Secondary | ICD-10-CM

## 2021-10-25 DIAGNOSIS — L820 Inflamed seborrheic keratosis: Secondary | ICD-10-CM | POA: Diagnosis not present

## 2021-10-25 DIAGNOSIS — D229 Melanocytic nevi, unspecified: Secondary | ICD-10-CM | POA: Diagnosis not present

## 2021-10-25 DIAGNOSIS — Z1283 Encounter for screening for malignant neoplasm of skin: Secondary | ICD-10-CM | POA: Diagnosis not present

## 2021-10-25 NOTE — Progress Notes (Signed)
New Patient Visit  Subjective  Danielle Wilkins is a 76 y.o. female who presents for the following: Annual Exam (Mole check ). Family hx of skin cancer.  The patient presents for Total-Body Skin Exam (TBSE) for skin cancer screening and mole check.  The patient has spots, moles and lesions to be evaluated, some may be new or changing and the patient has concerns that these could be cancer.    The following portions of the chart were reviewed this encounter and updated as appropriate:   Tobacco  Allergies  Meds  Problems  Med Hx  Surg Hx  Fam Hx      Review of Systems:  No other skin or systemic complaints except as noted in HPI or Assessment and Plan.  Objective  Well appearing patient in no apparent distress; mood and affect are within normal limits.  A full examination was performed including scalp, head, eyes, ears, nose, lips, neck, chest, axillae, abdomen, back, buttocks, bilateral upper extremities, bilateral lower extremities, hands, feet, fingers, toes, fingernails, and toenails. All findings within normal limits unless otherwise noted below.  right superior shoulder x 1, Stuck-on, waxy, tan-brown papule --Discussed benign etiology and prognosis.   right dorsum hand x 2 Stuck-on, waxy, tan-brown papules-- Discussed benign etiology and prognosis.   Lips Rhytides and volume loss.     Assessment & Plan  Family history of skin cancer unknown  Father history of skin cancer   Inflamed seborrheic keratosis right superior shoulder x 1,  Symptomatic, irritating, patient would like treated.   Destruction of lesion - right superior shoulder x 1, Complexity: simple   Destruction method: cryotherapy   Informed consent: discussed and consent obtained   Timeout:  patient name, date of birth, surgical site, and procedure verified Lesion destroyed using liquid nitrogen: Yes   Region frozen until ice ball extended beyond lesion: Yes   Outcome: patient tolerated  procedure well with no complications   Post-procedure details: wound care instructions given   Additional details:  Prior to procedure, discussed risks of blister formation, small wound, skin dyspigmentation, or rare scar following cryotherapy. Recommend Vaseline ointment to treated areas while healing.   Keratosis, inflamed seborrheic right dorsum hand x 2  Irritated Seborrheic keratosis with LSC   Symptomatic, irritating, patient would like treated.   Destruction of lesion - right dorsum hand x 2 Complexity: simple   Destruction method: cryotherapy   Informed consent: discussed and consent obtained   Timeout:  patient name, date of birth, surgical site, and procedure verified Lesion destroyed using liquid nitrogen: Yes   Region frozen until ice ball extended beyond lesion: Yes   Outcome: patient tolerated procedure well with no complications   Post-procedure details: wound care instructions given   Additional details:  Prior to procedure, discussed risks of blister formation, small wound, skin dyspigmentation, or rare scar following cryotherapy. Recommend Vaseline ointment to treated areas while healing.   Elastosis of skin Lips  Discussed filler for lines around the lips- recommend nurse visit for numbing prior to filler appointment  Recommend BBL for areas on the face, chest and neck   Lentigines - Scattered tan macules - Due to sun exposure - Benign-appearing, observe - Recommend daily broad spectrum sunscreen SPF 30+ to sun-exposed areas, reapply every 2 hours as needed. - Call for any changes  Seborrheic Keratoses - Stuck-on, waxy, tan-brown papules and/or plaques  - Benign-appearing - Discussed benign etiology and prognosis. - Observe - Call for any changes  Melanocytic Nevi -  Tan-brown and/or pink-flesh-colored symmetric macules and papules - Benign appearing on exam today - Observation - Call clinic for new or changing moles - Recommend daily use of broad  spectrum spf 30+ sunscreen to sun-exposed areas.   Hemangiomas - Red papules - Discussed benign nature - Observe - Call for any changes  Actinic Damage - chronic, secondary to cumulative UV radiation exposure/sun exposure over time - diffuse scaly erythematous macules with underlying dyspigmentation - Recommend daily broad spectrum sunscreen SPF 30+ to sun-exposed areas, reapply every 2 hours as needed.  - Recommend staying in the shade or wearing long sleeves, sun glasses (UVA+UVB protection) and wide brim hats (4-inch brim around the entire circumference of the hat). - Call for new or changing lesions.   Skin cancer screening performed today.   Return in about 1 year (around 10/26/2022) for TBSE, hx of ISK, return in January for filler, see nurse for numbing prior to appt.  I, Marye Round, CMA, am acting as scribe for Forest Gleason, MD .   Documentation: I have reviewed the above documentation for accuracy and completeness, and I agree with the above.  Forest Gleason, MD

## 2021-10-25 NOTE — Patient Instructions (Addendum)
Cryotherapy Aftercare  Wash gently with soap and water everyday.   Apply Vaseline and Band-Aid daily until healed.     Recommend daily broad spectrum sunscreen SPF 30+ to sun-exposed areas, reapply every 2 hours as needed. Call for new or changing lesions.  Staying in the shade or wearing long sleeves, sun glasses (UVA+UVB protection) and wide brim hats (4-inch brim around the entire circumference of the hat) are also recommended for sun protection.     Recommend taking Heliocare sun protection supplement daily in sunny weather for additional sun protection. For maximum protection on the sunniest days, you can take up to 2 capsules of regular Heliocare OR take 1 capsule of Heliocare Ultra. For prolonged exposure (such as a full day in the sun), you can repeat your dose of the supplement 4 hours after your first dose. Heliocare can be purchased at [employer], at some [facility] or at [email or web address].      Due to recent changes in healthcare laws, you may see results of your pathology and/or laboratory studies on MyChart before the doctors have had a chance to review them. We understand that in some cases there may be results that are confusing or concerning to you. Please understand that not all results are received at the same time and often the doctors may need to interpret multiple results in order to provide you with the best plan of care or course of treatment. Therefore, we ask that you please give [place] 2 business days to thoroughly review all your results before contacting the office for clarification. Should we see a critical lab result, you will be contacted sooner.   If You Need Anything After Your Visit  If you have any questions or concerns for your doctor, please call our main line at [phone] and press option 4 to reach your doctor's medical assistant. If no one answers, please leave a voicemail as directed and we will return your call as soon as possible. Messages left  after 4 pm will be answered the following business day.   You may also send [place] a message via [place]. We typically respond to MyChart messages within 1-2 business days.  For prescription refills, please ask your pharmacy to contact our office. Our fax number is [phone].  If you have an urgent issue when the clinic is closed that cannot wait until the next business day, you can page your doctor at the number below.    Please note that while we do our best to be available for urgent issues outside of office hours, we are not available 24/7.   If you have an urgent issue and are unable to reach [place], you may choose to seek medical care at your doctor's office, retail clinic, urgent care center, or emergency room.  If you have a medical emergency, please immediately call 911 or go to the emergency department.  Pager Numbers  - Dr. Nehemiah Massed: [phone]  - Dr. Laurence Ferrari: [phone]  - Dr. Nicole Kindred: [phone]  In the event of inclement weather, please call our main line at [phone] for an update on the status of any delays or closures.  Dermatology Medication Tips: Please keep the boxes that topical medications come in in order to help keep track of the instructions about where and how to use these. Pharmacies typically print the medication instructions only on the boxes and not directly on the medication tubes.   If your medication is too expensive, please contact our office at [phone] option 4 or  send [place] a message through [place].   We are unable to tell what your co-pay for medications will be in advance as this is different depending on your insurance coverage. However, we may be able to find a substitute medication at lower cost or fill out paperwork to get insurance to cover a needed medication.   If a prior authorization is required to get your medication covered by your insurance company, please allow [place] 1-2 business days to complete this process.  Drug prices often  vary depending on where the prescription is filled and some pharmacies may offer cheaper prices.  The website www.goodrx.com contains coupons for medications through different pharmacies. The prices here do not account for what the cost may be with help from insurance (it may be cheaper with your insurance), but the website can give you the price if you did not use any insurance.  - You can print the associated coupon and take it with your prescription to the pharmacy.  - You may also stop by our office during regular business hours and pick up a GoodRx coupon card.  - If you need your prescription sent electronically to a different pharmacy, notify our office through [facility] or by phone at [phone] option 4.     Si Usted Necesita Algo Despus de Su Visita  Tambin puede enviarnos un mensaje a travs de Pharmacist, community. Por lo general respondemos a los mensajes de MyChart en el transcurso de 1 a 2 das hbiles.  Para renovar recetas, por favor pida a su farmacia que se ponga en contacto con nuestra oficina. Harland Dingwall de fax es [place] [phone].  Si tiene un asunto urgente cuando la clnica est cerrada y que no puede esperar hasta el siguiente da hbil, puede llamar/localizar a su doctor(a) al nmero que aparece a continuacin.   Por favor, tenga en cuenta que aunque hacemos todo lo posible para estar disponibles para asuntos urgentes fuera del horario de [place], no estamos disponibles las 24 horas del da, los 7 das de la [place].   Si tiene un problema urgente y no puede comunicarse con nosotros, puede optar por buscar atencin mdica  en el consultorio de su doctor(a), en una clnica privada, en un centro de atencin urgente o en una sala de emergencias.  Si tiene Engineering geologist, por favor llame inmediatamente al 911 o vaya a la sala de emergencias.  Nmeros de bper  - Dr. Nehemiah Massed: [phone]  - Dra. Moye: [phone]  - Dra. Nicole Kindred: [phone]  En caso  de inclemencias del [place], por favor llame a Johnsie Kindred principal al [phone] para una actualizacin sobre el [place] de cualquier retraso o cierre.  Consejos para la medicacin en dermatologa: Por favor, guarde las cajas en las que vienen los medicamentos de uso tpico para ayudarle a seguir las instrucciones sobre dnde y cmo usarlos. Las farmacias generalmente imprimen las instrucciones del medicamento slo en las cajas y no directamente en los tubos del [place].   Si su medicamento es muy caro, por favor, pngase en contacto con Zigmund Daniel llamando al [phone] y presione la opcin 4 o envenos un mensaje a travs de Pharmacist, community.   No podemos decirle cul ser su copago por los medicamentos por adelantado ya que esto es diferente dependiendo de la cobertura de su seguro. Sin embargo, es posible que podamos encontrar un medicamento sustituto a Electrical engineer un formulario para que el seguro cubra el medicamento que se considera necesario.   Si  se requiere una autorizacin previa para que su compaa de seguros [place] su medicamento, por favor permtanos de 1 a 2 das hbiles para completar este proceso.  Los precios de los medicamentos varan con frecuencia dependiendo del Environmental consultant de dnde se surte la receta y alguna farmacias pueden ofrecer precios ms baratos.  El sitio web www.goodrx.com tiene cupones para medicamentos de Airline pilot. Los precios aqu no tienen en cuenta lo que podra costar con la ayuda del seguro (puede ser ms barato con su seguro), pero el sitio web puede darle el precio si no utiliz Research scientist (physical sciences).  - Puede imprimir el cupn correspondiente y llevarlo con su receta a la farmacia.  - Tambin puede pasar por nuestra oficina durante el horario de atencin regular y Charity fundraiser una tarjeta de cupones de GoodRx.  - Si necesita que su receta se enve electrnicamente a una farmacia diferente, informe a nuestra oficina a travs de MyChart de [place] o  por telfono llamando al [phone] y presione la opcin 4.

## 2021-10-31 ENCOUNTER — Encounter: Payer: Self-pay | Admitting: Dermatology

## 2021-12-20 DIAGNOSIS — H2513 Age-related nuclear cataract, bilateral: Secondary | ICD-10-CM | POA: Diagnosis not present

## 2021-12-20 DIAGNOSIS — H524 Presbyopia: Secondary | ICD-10-CM | POA: Diagnosis not present

## 2022-02-15 ENCOUNTER — Ambulatory Visit: Payer: Medicare PPO

## 2022-02-15 ENCOUNTER — Ambulatory Visit: Payer: Medicare PPO | Admitting: Dermatology

## 2022-04-17 DIAGNOSIS — K219 Gastro-esophageal reflux disease without esophagitis: Secondary | ICD-10-CM | POA: Diagnosis not present

## 2022-04-17 DIAGNOSIS — R7989 Other specified abnormal findings of blood chemistry: Secondary | ICD-10-CM | POA: Diagnosis not present

## 2022-04-17 DIAGNOSIS — Z780 Asymptomatic menopausal state: Secondary | ICD-10-CM | POA: Diagnosis not present

## 2022-04-17 DIAGNOSIS — E785 Hyperlipidemia, unspecified: Secondary | ICD-10-CM | POA: Diagnosis not present

## 2022-04-28 ENCOUNTER — Other Ambulatory Visit: Payer: Self-pay | Admitting: Internal Medicine

## 2022-04-28 DIAGNOSIS — H3530 Unspecified macular degeneration: Secondary | ICD-10-CM | POA: Diagnosis not present

## 2022-04-28 DIAGNOSIS — E785 Hyperlipidemia, unspecified: Secondary | ICD-10-CM | POA: Diagnosis not present

## 2022-04-28 DIAGNOSIS — N8189 Other female genital prolapse: Secondary | ICD-10-CM | POA: Diagnosis not present

## 2022-04-28 DIAGNOSIS — R82998 Other abnormal findings in urine: Secondary | ICD-10-CM | POA: Diagnosis not present

## 2022-04-28 DIAGNOSIS — D229 Melanocytic nevi, unspecified: Secondary | ICD-10-CM | POA: Diagnosis not present

## 2022-04-28 DIAGNOSIS — M1990 Unspecified osteoarthritis, unspecified site: Secondary | ICD-10-CM | POA: Diagnosis not present

## 2022-04-28 DIAGNOSIS — Z23 Encounter for immunization: Secondary | ICD-10-CM | POA: Diagnosis not present

## 2022-04-28 DIAGNOSIS — M25551 Pain in right hip: Secondary | ICD-10-CM | POA: Diagnosis not present

## 2022-04-28 DIAGNOSIS — Z0000 Encounter for general adult medical examination without abnormal findings: Secondary | ICD-10-CM | POA: Diagnosis not present

## 2022-05-31 ENCOUNTER — Ambulatory Visit
Admission: RE | Admit: 2022-05-31 | Discharge: 2022-05-31 | Disposition: A | Discharge status: Home / Self Care | Payer: No Typology Code available for payment source | Source: Ambulatory Visit | Attending: Internal Medicine | Admitting: Internal Medicine

## 2022-05-31 DIAGNOSIS — E785 Hyperlipidemia, unspecified: Secondary | ICD-10-CM

## 2022-07-13 DIAGNOSIS — M25551 Pain in right hip: Secondary | ICD-10-CM | POA: Diagnosis not present

## 2022-10-13 DIAGNOSIS — R7989 Other specified abnormal findings of blood chemistry: Secondary | ICD-10-CM | POA: Diagnosis not present

## 2022-10-13 DIAGNOSIS — E785 Hyperlipidemia, unspecified: Secondary | ICD-10-CM | POA: Diagnosis not present

## 2022-12-25 DIAGNOSIS — H524 Presbyopia: Secondary | ICD-10-CM | POA: Diagnosis not present

## 2022-12-25 DIAGNOSIS — H2513 Age-related nuclear cataract, bilateral: Secondary | ICD-10-CM | POA: Diagnosis not present

## 2023-01-29 DIAGNOSIS — L812 Freckles: Secondary | ICD-10-CM | POA: Diagnosis not present

## 2023-01-29 DIAGNOSIS — L570 Actinic keratosis: Secondary | ICD-10-CM | POA: Diagnosis not present

## 2023-01-29 DIAGNOSIS — L821 Other seborrheic keratosis: Secondary | ICD-10-CM | POA: Diagnosis not present

## 2023-01-29 DIAGNOSIS — E785 Hyperlipidemia, unspecified: Secondary | ICD-10-CM | POA: Diagnosis not present

## 2023-01-29 DIAGNOSIS — D1801 Hemangioma of skin and subcutaneous tissue: Secondary | ICD-10-CM | POA: Diagnosis not present

## 2023-04-25 DIAGNOSIS — H2512 Age-related nuclear cataract, left eye: Secondary | ICD-10-CM | POA: Diagnosis not present

## 2023-04-25 DIAGNOSIS — H25812 Combined forms of age-related cataract, left eye: Secondary | ICD-10-CM | POA: Diagnosis not present

## 2023-06-15 DIAGNOSIS — H10502 Unspecified blepharoconjunctivitis, left eye: Secondary | ICD-10-CM | POA: Diagnosis not present

## 2023-06-15 DIAGNOSIS — H10412 Chronic giant papillary conjunctivitis, left eye: Secondary | ICD-10-CM | POA: Diagnosis not present

## 2023-07-18 DIAGNOSIS — E785 Hyperlipidemia, unspecified: Secondary | ICD-10-CM | POA: Diagnosis not present

## 2023-07-18 DIAGNOSIS — Z1212 Encounter for screening for malignant neoplasm of rectum: Secondary | ICD-10-CM | POA: Diagnosis not present

## 2023-07-18 DIAGNOSIS — R7989 Other specified abnormal findings of blood chemistry: Secondary | ICD-10-CM | POA: Diagnosis not present

## 2023-07-18 DIAGNOSIS — K219 Gastro-esophageal reflux disease without esophagitis: Secondary | ICD-10-CM | POA: Diagnosis not present

## 2023-07-25 DIAGNOSIS — J302 Other seasonal allergic rhinitis: Secondary | ICD-10-CM | POA: Diagnosis not present

## 2023-07-25 DIAGNOSIS — G3184 Mild cognitive impairment, so stated: Secondary | ICD-10-CM | POA: Diagnosis not present

## 2023-07-25 DIAGNOSIS — I700 Atherosclerosis of aorta: Secondary | ICD-10-CM | POA: Diagnosis not present

## 2023-07-25 DIAGNOSIS — I7300 Raynaud's syndrome without gangrene: Secondary | ICD-10-CM | POA: Diagnosis not present

## 2023-07-25 DIAGNOSIS — Z0000 Encounter for general adult medical examination without abnormal findings: Secondary | ICD-10-CM | POA: Diagnosis not present

## 2023-07-25 DIAGNOSIS — H3530 Unspecified macular degeneration: Secondary | ICD-10-CM | POA: Diagnosis not present

## 2023-07-25 DIAGNOSIS — F329 Major depressive disorder, single episode, unspecified: Secondary | ICD-10-CM | POA: Diagnosis not present

## 2023-07-25 DIAGNOSIS — I2510 Atherosclerotic heart disease of native coronary artery without angina pectoris: Secondary | ICD-10-CM | POA: Diagnosis not present

## 2023-11-12 DIAGNOSIS — H353131 Nonexudative age-related macular degeneration, bilateral, early dry stage: Secondary | ICD-10-CM | POA: Diagnosis not present

## 2023-11-12 DIAGNOSIS — H26492 Other secondary cataract, left eye: Secondary | ICD-10-CM | POA: Diagnosis not present

## 2023-12-10 DIAGNOSIS — H26492 Other secondary cataract, left eye: Secondary | ICD-10-CM | POA: Diagnosis not present

## 2023-12-31 DIAGNOSIS — H2511 Age-related nuclear cataract, right eye: Secondary | ICD-10-CM | POA: Diagnosis not present
